# Patient Record
Sex: Male | Born: 2005 | Race: Black or African American | Hispanic: No | Marital: Single
Health system: Southern US, Community
[De-identification: ages and names within clinical notes are randomized; demographics above are authoritative.]

## PROBLEM LIST (undated history)

## (undated) DIAGNOSIS — F909 Attention-deficit hyperactivity disorder, unspecified type: Secondary | ICD-10-CM

## (undated) DIAGNOSIS — H547 Unspecified visual loss: Secondary | ICD-10-CM

## (undated) HISTORY — PX: EXTERNAL EAR SURGERY: SHX627

## (undated) HISTORY — PX: HERNIA REPAIR: SHX51

---
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
## 2024-04-06 ENCOUNTER — Other Ambulatory Visit: Payer: Self-pay

## 2024-04-06 ENCOUNTER — Emergency Department (HOSPITAL_COMMUNITY)
Admission: EM | Admit: 2024-04-06 | Discharge: 2024-04-07 | Disposition: A | Discharge status: Home / Self Care | Payer: MEDICAID | Attending: Emergency Medicine | Admitting: Emergency Medicine

## 2024-04-06 ENCOUNTER — Encounter (HOSPITAL_COMMUNITY): Payer: Self-pay | Admitting: *Deleted

## 2024-04-06 DIAGNOSIS — F17200 Nicotine dependence, unspecified, uncomplicated: Secondary | ICD-10-CM | POA: Diagnosis not present

## 2024-04-06 DIAGNOSIS — E876 Hypokalemia: Secondary | ICD-10-CM | POA: Insufficient documentation

## 2024-04-06 DIAGNOSIS — R112 Nausea with vomiting, unspecified: Secondary | ICD-10-CM | POA: Insufficient documentation

## 2024-04-06 DIAGNOSIS — R1084 Generalized abdominal pain: Secondary | ICD-10-CM | POA: Insufficient documentation

## 2024-04-06 LAB — CBC
HCT: 55.7 % — ABNORMAL HIGH (ref 39.0–52.0)
Hemoglobin: 18.9 g/dL — ABNORMAL HIGH (ref 13.0–17.0)
MCH: 31.6 pg (ref 26.0–34.0)
MCHC: 33.9 g/dL (ref 30.0–36.0)
MCV: 93.1 fL (ref 80.0–100.0)
Platelets: 309 10*3/uL (ref 150–400)
RBC: 5.98 MIL/uL — ABNORMAL HIGH (ref 4.22–5.81)
RDW: 12.1 % (ref 11.5–15.5)
WBC: 10.3 10*3/uL (ref 4.0–10.5)
nRBC: 0 % (ref 0.0–0.2)

## 2024-04-06 LAB — URINALYSIS, ROUTINE W REFLEX MICROSCOPIC
Bacteria, UA: NONE SEEN
Glucose, UA: NEGATIVE mg/dL
Hgb urine dipstick: NEGATIVE
Ketones, ur: 20 mg/dL — AB
Leukocytes,Ua: NEGATIVE
Nitrite: NEGATIVE
Protein, ur: 30 mg/dL — AB
Specific Gravity, Urine: 1.032 — ABNORMAL HIGH (ref 1.005–1.030)
pH: 5 (ref 5.0–8.0)

## 2024-04-06 LAB — COMPREHENSIVE METABOLIC PANEL WITH GFR
ALT: 93 U/L — ABNORMAL HIGH (ref 0–44)
AST: 73 U/L — ABNORMAL HIGH (ref 15–41)
Albumin: 4.6 g/dL (ref 3.5–5.0)
Alkaline Phosphatase: 55 U/L (ref 38–126)
Anion gap: 16 — ABNORMAL HIGH (ref 5–15)
BUN: 19 mg/dL (ref 6–20)
CO2: 25 mmol/L (ref 22–32)
Calcium: 9.8 mg/dL (ref 8.9–10.3)
Chloride: 95 mmol/L — ABNORMAL LOW (ref 98–111)
Creatinine, Ser: 1.31 mg/dL — ABNORMAL HIGH (ref 0.61–1.24)
GFR, Estimated: >60 mL/min (ref >60)
Glucose, Bld: 101 mg/dL — ABNORMAL HIGH (ref 70–99)
Potassium: 3 mmol/L — ABNORMAL LOW (ref 3.5–5.1)
Sodium: 136 mmol/L (ref 135–145)
Total Bilirubin: 2.1 mg/dL — ABNORMAL HIGH (ref 0.0–1.2)
Total Protein: 8.6 g/dL — ABNORMAL HIGH (ref 6.5–8.1)

## 2024-04-06 LAB — LIPASE, BLOOD: Lipase: 23 U/L (ref 11–51)

## 2024-04-06 MED ORDER — ONDANSETRON 4 MG PO TBDP
8.0000 mg | ORAL_TABLET | Freq: Once | ORAL | Status: AC
  Administered 2024-04-06: 8 mg via ORAL
  Filled 2024-04-06: qty 2
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
## 2024-04-07 ENCOUNTER — Emergency Department (HOSPITAL_COMMUNITY): Payer: MEDICAID

## 2024-04-07 LAB — MAGNESIUM: Magnesium: 2.1 mg/dL (ref 1.7–2.4)

## 2024-04-07 MED ORDER — IOHEXOL 350 MG/ML SOLN
75.0000 mL | Freq: Once | INTRAVENOUS | Status: AC | PRN
  Administered 2024-04-07: 75 mL via INTRAVENOUS

## 2024-04-07 MED ORDER — SODIUM CHLORIDE 0.9 % IV BOLUS
1000.0000 mL | Freq: Once | INTRAVENOUS | Status: AC
  Administered 2024-04-07: 1000 mL via INTRAVENOUS

## 2024-04-07 MED ORDER — ONDANSETRON HCL 4 MG PO TABS: 4.0000 mg | ORAL_TABLET | Freq: Four times a day (QID) | ORAL | 0 refills | Status: AC

## 2024-04-07 MED ORDER — ONDANSETRON HCL 4 MG/2ML IJ SOLN
4.0000 mg | Freq: Once | INTRAMUSCULAR | Status: AC
  Administered 2024-04-07: 4 mg via INTRAVENOUS
  Filled 2024-04-07: qty 2

## 2024-04-07 MED ORDER — MAGNESIUM OXIDE -MG SUPPLEMENT 400 (240 MG) MG PO TABS
400.0000 mg | ORAL_TABLET | Freq: Once | ORAL | Status: AC
  Administered 2024-04-07: 400 mg via ORAL
  Filled 2024-04-07: qty 1

## 2024-04-07 MED ORDER — POTASSIUM CHLORIDE CRYS ER 20 MEQ PO TBCR: 20.0000 meq | EXTENDED_RELEASE_TABLET | Freq: Two times a day (BID) | ORAL | 0 refills | Status: AC

## 2024-04-07 NOTE — ED Notes (Signed)
 IV attempt to LAC unsuccessful by this RN. Float nurse Beth notified. Beth to attempt US  IV insertion.

## 2024-04-07 NOTE — Discharge Instructions (Addendum)
 Please follow-up with your primary care provider in regards to symptoms and ER visit. You have been seen in the Emergency Department (ED) for abdominal pain.  Your labs and imaging did not identify a clear cause of your symptoms but was generally reassuring. Please follow up as instructed above regarding today's emergent visit and the symptoms that are bothering you. You may take Tylenol  every 6 hours as needed for pain. I have prescribed Zofran  to help with your nausea. Return to the ED if your abdominal pain worsens or fails to improve, you develop bloody vomiting, bloody diarrhea, you are unable to tolerate fluids due to vomiting, fever greater than 101, or other symptoms that concern you.

## 2024-04-07 NOTE — ED Notes (Signed)
Fluids given for PO challenge

## 2024-04-07 NOTE — ED Notes (Signed)
 Patient transported to Ultrasound

## 2024-04-07 NOTE — ED Provider Notes (Signed)
 Patient given in sign out by [place], PA-C.  Please review their note for patient HPI, physical exam, workup.  At this time the plan is follow-up on CT scan.  Patient CT scan is unremarkable.  Patient's passed p.o. challenge.  I did a bedside ultrasound patient's gallbladder and I do see distended gallbladder however I do not see gallstones nor thickened gallbladder wall or dilated CBD.  Will get formal ultrasound but do feel that patient will be able to go home after ultrasound and we will send for a few days worth of potassium along with Zofran  and follow-up with his primary care provider.  I did discuss with mom the importance of staying hydrated and following up to which she agrees.  Patient also agrees.  Formal ultrasound was negative.  At this time patient states he feels better and like to go home.  Will prescribe few days worth potassium pills along with Zofran  and the plan listed above.  Patient stable to be discharged.  Patient verbalized understanding and acceptance of this plan.  Patient given return precautions.   EMERGENCY DEPARTMENT BILIARY ULTRASOUND INTERPRETATION "Study: Limited Abdominal Ultrasound of the Gallbladder and Common Bile Duct."  INDICATIONS: Nausea and vomiting Indication: Multiple views of the gallbladder and common bile duct were obtained in real-time with a Multi-frequency probe."  PERFORMED BY:  Myself IMAGES ARCHIVED?: Yes LIMITATIONS: None INTERPRETATION: Normal, Gallbladder wall normal in thickness, Sonographic Murphy's sign abscent, Common bile duct normal in size, and Dilated Gallbladder   Denese Finn, PA-C 04/07/24 0857    Alissa April, MD 04/07/24 2237

## 2024-04-07 NOTE — ED Notes (Signed)
 Pt returned from CT via stretcher.

## 2024-04-07 NOTE — ED Notes (Signed)
Legal guardian notified of discharge

## 2024-04-07 NOTE — ED Provider Notes (Signed)
 [place] EMERGENCY DEPARTMENT AT [facility] Provider Note   CSN: [id number] Arrival date & time: 04/06/24  2132     History  Chief Complaint  Patient presents with   Emesis   Abdominal Pain    Jeffrey Caranci is a 18 y.o. male.  Patient with past medical history significant for ADHD, learning disability, high blood pressure presents to the emergency department accompanied by his mother complaining of nausea and vomiting.  Patient reports that symptoms began approximately 1 week ago.  He said multiple episodes of vomiting over the past week and noticed a few streaks of blood in the vomit earlier today.  Patient had a normal bowel movement yesterday.  He states he is kept down no solid food but has been giving him small amounts of water intermittently over the past week.  He endorses intermittent marijuana usage with the most recent usage being approximately 1 to 2 weeks ago.  He uses marijuana approximately once a week.  Patient complains of generalized abdominal discomfort but no focal pain.   Emesis Associated symptoms: abdominal pain   Abdominal Pain Associated symptoms: vomiting        Home Medications Prior to Admission medications   Medication Sig Start Date End Date Taking? Authorizing Provider  hydrOXYzine  (ATARAX ) 25 MG tablet Take 1 tablet (25 mg total) by mouth 3 (three) times daily as needed for anxiety. 02/11/22   Penn, Alaine Howells, NP  OXcarbazepine  (TRILEPTAL ) 150 MG tablet Take 1 tablet (150 mg total) by mouth 2 (two) times daily. 02/11/22   Penn, Alaine Howells, NP  traZODone  (DESYREL ) 50 MG tablet Take 1 tablet (50 mg total) by mouth at bedtime as needed for sleep. 02/11/22   Monroe Antigua, NP      Allergies    Patient has no known allergies.    Review of Systems   Review of Systems  Gastrointestinal:  Positive for abdominal pain and vomiting.    Physical Exam Updated Vital Signs BP (!) 157/91   Pulse 79   Temp 98.1 F (36.7 C) (Oral)   Resp 15   Ht 5\' 2"   (1.575 m)   Wt 69.9 kg   SpO2 99%   BMI 28.19 kg/m  Physical Exam Vitals and nursing note reviewed.  Constitutional:      General: He is not in acute distress.    Appearance: He is well-developed.  HENT:     Head: Normocephalic and atraumatic.  Eyes:     Conjunctiva/sclera: Conjunctivae normal.  Cardiovascular:     Rate and Rhythm: Normal rate and regular rhythm.     Heart sounds: No murmur heard. Pulmonary:     Effort: Pulmonary effort is normal. No respiratory distress.     Breath sounds: Normal breath sounds.  Abdominal:     Palpations: Abdomen is soft.     Tenderness: There is no abdominal tenderness.  Musculoskeletal:        General: No swelling.     Cervical back: Neck supple.  Skin:    General: Skin is warm and dry.     Capillary Refill: Capillary refill takes less than 2 seconds.  Neurological:     Mental Status: He is alert.  Psychiatric:        Mood and Affect: Mood normal.     ED Results / Procedures / Treatments   Labs (all labs ordered are listed, but only abnormal results are displayed) Labs Reviewed  COMPREHENSIVE METABOLIC PANEL WITH GFR - Abnormal; Notable for the following components:  Result Value   Potassium 3.0 (*)    Chloride 95 (*)    Glucose, Bld 101 (*)    Creatinine, Ser 1.31 (*)    Total Protein 8.6 (*)    AST 73 (*)    ALT 93 (*)    Total Bilirubin 2.1 (*)    Anion gap 16 (*)    All other components within normal limits  CBC - Abnormal; Notable for the following components:   RBC 5.98 (*)    Hemoglobin 18.9 (*)    HCT 55.7 (*)    All other components within normal limits  URINALYSIS, ROUTINE W REFLEX MICROSCOPIC - Abnormal; Notable for the following components:   Color, Urine AMBER (*)    Specific Gravity, Urine 1.032 (*)    Bilirubin Urine SMALL (*)    Ketones, ur 20 (*)    Protein, ur 30 (*)    All other components within normal limits  LIPASE, BLOOD  MAGNESIUM     EKG EKG Interpretation Date/Time:  Saturday Apr 07 2024 01:46:30 EDT Ventricular Rate:  84 PR Interval:  112 QRS Duration:  78 QT Interval:  443 QTC Calculation: 524 R Axis:   74  Text Interpretation: Sinus rhythm Borderline short PR interval Right atrial enlargement LVH by voltage Prolonged QT interval When compared with ECG of 10/27/2004, HEART RATE has changed QT has lengthened Confirmed by Alissa April ([id number]) on 04/07/2024 1:49:59 AM  Radiology No results found.  Procedures Procedures    Medications Ordered in ED Medications  ondansetron  (ZOFRAN -ODT) disintegrating tablet 8 mg (8 mg Oral Given 04/06/24 2205)  sodium chloride 0.9 % bolus 1,000 mL (0 mLs Intravenous Stopped 04/07/24 0549)  ondansetron  (ZOFRAN ) injection 4 mg (4 mg Intravenous Given 04/07/24 0401)  iohexol (OMNIPAQUE) 350 MG/ML injection 75 mL (75 mLs Intravenous Contrast Given 04/07/24 0447)    ED Course/ Medical Decision Making/ A&P                                 Medical Decision Making Amount and/or Complexity of Data Reviewed Radiology: ordered.  Risk Prescription drug management.   This patient presents to the ED for concern of nausea and vomiting, this involves an extensive number of treatment options, and is a complaint that carries with it a high risk of complications and morbidity.  The differential diagnosis includes gastritis, cyclical vomiting syndrome, cannabinoid hyperemesis syndrome, intra-abdominal/surgical etiology, others   Co morbidities that complicate the patient evaluation  Learning disability   Additional history obtained:  Additional history obtained from patient's mother  Lab Tests:  I Ordered, and personally interpreted labs.  The pertinent results include: Mild hypokalemia with a potassium of 3.0; UA with ketones and protein; anion gap of 16, mildly elevated transaminases with AST 73, ALT 93; total bilirubin 2.1   Imaging Studies ordered:  I ordered imaging studies including ct abdomen pelvis with contrast  I agree with  the radiologist interpretation   Cardiac Monitoring: / EKG:  The patient was maintained on a cardiac monitor.  I personally viewed and interpreted the cardiac monitored which showed an underlying rhythm of: sinus rhythm   Problem List / ED Course / Critical interventions / Medication management   I ordered medication including Zofran  for nausea, saline bolus for fluid resuscitation Reevaluation of the patient after these medicines showed that the patient improved I have reviewed the patients home medicines and have made adjustments as needed  Social  Determinants of Health:  Patient is a daily tobacco smoker   Test / Admission - Considered:  Patient care being transferred to Melda Spore, PA-C at shift handoff.  Disposition pending imaging results and reassessment.          Final Clinical Impression(s) / ED Diagnoses Final diagnoses:  None    Rx / DC Orders ED Discharge Orders     None         Delories Fetter 04/07/24 [id number]    Alissa April, MD 04/07/24 [phone]

## 2024-04-07 NOTE — ED Notes (Signed)
 Pt completed PO challenge with 16 ounces of water.

## 2024-04-07 NOTE — ED Notes (Signed)
 ED Provider at bedside.

## 2024-04-07 NOTE — ED Notes (Signed)
 Patient with legal guardian to lobby.

## 2024-08-09 ENCOUNTER — Telehealth: Payer: Self-pay

## 2024-08-09 NOTE — Telephone Encounter (Signed)
 Received medical records request from Disability [employer]. Emailed to [email or web address]

## 2024-08-27 ENCOUNTER — Telehealth: Payer: Self-pay

## 2024-08-27 NOTE — Telephone Encounter (Signed)
 Received the patient's Disability Determination Evaluation form and forwarded it to [employer] ([employer]).
